# Patient Record
Sex: Female | Born: 1965 | Race: White | Hispanic: No | Marital: Married | Smoking: Never smoker
Health system: Southern US, Community
[De-identification: ages and names within clinical notes are randomized; demographics above are authoritative.]

## PROBLEM LIST (undated history)

## (undated) DIAGNOSIS — I4891 Unspecified atrial fibrillation: Secondary | ICD-10-CM

## (undated) DIAGNOSIS — I10 Essential (primary) hypertension: Secondary | ICD-10-CM

---

## 2006-05-31 ENCOUNTER — Encounter: Admission: RE | Admit: 2006-05-31 | Discharge: 2006-05-31 | Payer: Self-pay | Admitting: Obstetrics and Gynecology

## 2009-02-04 ENCOUNTER — Encounter: Admission: RE | Admit: 2009-02-04 | Discharge: 2009-02-04 | Payer: Self-pay | Admitting: Obstetrics and Gynecology

## 2010-04-07 ENCOUNTER — Encounter: Admission: RE | Admit: 2010-04-07 | Discharge: 2010-04-07 | Payer: Self-pay | Admitting: Obstetrics and Gynecology

## 2011-06-16 ENCOUNTER — Other Ambulatory Visit: Payer: Self-pay | Admitting: Obstetrics and Gynecology

## 2011-06-16 DIAGNOSIS — Z1231 Encounter for screening mammogram for malignant neoplasm of breast: Secondary | ICD-10-CM

## 2011-10-03 ENCOUNTER — Ambulatory Visit
Admission: RE | Admit: 2011-10-03 | Discharge: 2011-10-03 | Disposition: A | Discharge status: Home / Self Care | Payer: BC Managed Care – PPO | Source: Ambulatory Visit | Attending: Obstetrics and Gynecology | Admitting: Obstetrics and Gynecology

## 2011-10-03 DIAGNOSIS — Z1231 Encounter for screening mammogram for malignant neoplasm of breast: Secondary | ICD-10-CM

## 2013-12-12 ENCOUNTER — Other Ambulatory Visit: Payer: Self-pay

## 2013-12-12 DIAGNOSIS — Z1231 Encounter for screening mammogram for malignant neoplasm of breast: Secondary | ICD-10-CM

## 2013-12-19 ENCOUNTER — Ambulatory Visit
Admission: RE | Admit: 2013-12-19 | Discharge: 2013-12-19 | Disposition: A | Discharge status: Home / Self Care | Payer: BC Managed Care – PPO | Source: Ambulatory Visit

## 2013-12-19 DIAGNOSIS — Z1231 Encounter for screening mammogram for malignant neoplasm of breast: Secondary | ICD-10-CM

## 2016-03-02 ENCOUNTER — Other Ambulatory Visit: Payer: Self-pay | Admitting: Obstetrics and Gynecology

## 2016-03-02 DIAGNOSIS — Z1231 Encounter for screening mammogram for malignant neoplasm of breast: Secondary | ICD-10-CM

## 2016-03-13 ENCOUNTER — Ambulatory Visit
Admission: RE | Admit: 2016-03-13 | Discharge: 2016-03-13 | Disposition: A | Discharge status: Home / Self Care | Payer: BLUE CROSS/BLUE SHIELD | Source: Ambulatory Visit | Attending: Obstetrics and Gynecology | Admitting: Obstetrics and Gynecology

## 2016-03-13 DIAGNOSIS — Z1231 Encounter for screening mammogram for malignant neoplasm of breast: Secondary | ICD-10-CM

## 2017-11-23 ENCOUNTER — Other Ambulatory Visit: Payer: Self-pay

## 2017-11-23 ENCOUNTER — Emergency Department (HOSPITAL_COMMUNITY)
Admission: EM | Admit: 2017-11-23 | Discharge: 2017-11-23 | Disposition: A | Discharge status: Home / Self Care | Payer: BLUE CROSS/BLUE SHIELD | Attending: Emergency Medicine | Admitting: Emergency Medicine

## 2017-11-23 ENCOUNTER — Emergency Department (HOSPITAL_COMMUNITY): Payer: BLUE CROSS/BLUE SHIELD

## 2017-11-23 ENCOUNTER — Encounter (HOSPITAL_COMMUNITY): Payer: Self-pay

## 2017-11-23 DIAGNOSIS — I10 Essential (primary) hypertension: Secondary | ICD-10-CM | POA: Diagnosis not present

## 2017-11-23 DIAGNOSIS — R5383 Other fatigue: Secondary | ICD-10-CM | POA: Insufficient documentation

## 2017-11-23 DIAGNOSIS — I4891 Unspecified atrial fibrillation: Secondary | ICD-10-CM | POA: Insufficient documentation

## 2017-11-23 DIAGNOSIS — I499 Cardiac arrhythmia, unspecified: Secondary | ICD-10-CM | POA: Diagnosis present

## 2017-11-23 HISTORY — DX: Essential (primary) hypertension: I10

## 2017-11-23 HISTORY — DX: Unspecified atrial fibrillation: I48.91

## 2017-11-23 LAB — URINALYSIS, ROUTINE W REFLEX MICROSCOPIC
BILIRUBIN URINE: NEGATIVE
Bacteria, UA: NONE SEEN
Glucose, UA: NEGATIVE mg/dL
Ketones, ur: NEGATIVE mg/dL
LEUKOCYTES UA: NEGATIVE
NITRITE: NEGATIVE
PROTEIN: NEGATIVE mg/dL
Specific Gravity, Urine: 1.009 (ref 1.005–1.030)
pH: 7 (ref 5.0–8.0)

## 2017-11-23 LAB — CBC WITH DIFFERENTIAL/PLATELET
BASOS ABS: 0 10*3/uL (ref 0.0–0.1)
Basophils Relative: 0 %
EOS PCT: 1 %
Eosinophils Absolute: 0.1 10*3/uL (ref 0.0–0.7)
HCT: 42.1 % (ref 36.0–46.0)
Hemoglobin: 13.6 g/dL (ref 12.0–15.0)
Lymphocytes Relative: 29 %
Lymphs Abs: 3.2 10*3/uL (ref 0.7–4.0)
MCH: 28.2 pg (ref 26.0–34.0)
MCHC: 32.3 g/dL (ref 30.0–36.0)
MCV: 87.3 fL (ref 78.0–100.0)
MONO ABS: 0.6 10*3/uL (ref 0.1–1.0)
Monocytes Relative: 6 %
NEUTROS ABS: 7 10*3/uL (ref 1.7–7.7)
Neutrophils Relative %: 64 %
PLATELETS: 308 10*3/uL (ref 150–400)
RBC: 4.82 MIL/uL (ref 3.87–5.11)
RDW: 14.2 % (ref 11.5–15.5)
WBC: 10.9 10*3/uL — AB (ref 4.0–10.5)

## 2017-11-23 LAB — I-STAT TROPONIN, ED: Troponin i, poc: 0 ng/mL (ref 0.00–0.08)

## 2017-11-23 LAB — COMPREHENSIVE METABOLIC PANEL
ALBUMIN: 3.5 g/dL (ref 3.5–5.0)
ALT: 23 U/L (ref 14–54)
AST: 20 U/L (ref 15–41)
Alkaline Phosphatase: 77 U/L (ref 38–126)
Anion gap: 8 (ref 5–15)
BUN: 10 mg/dL (ref 6–20)
CHLORIDE: 104 mmol/L (ref 101–111)
CO2: 26 mmol/L (ref 22–32)
Calcium: 8.9 mg/dL (ref 8.9–10.3)
Creatinine, Ser: 0.77 mg/dL (ref 0.44–1.00)
GFR calc Af Amer: >60 mL/min (ref >60)
GFR calc non Af Amer: >60 mL/min (ref >60)
Glucose, Bld: 111 mg/dL — ABNORMAL HIGH (ref 65–99)
POTASSIUM: 3.9 mmol/L (ref 3.5–5.1)
Sodium: 138 mmol/L (ref 135–145)
Total Bilirubin: 0.3 mg/dL (ref 0.3–1.2)
Total Protein: 7.3 g/dL (ref 6.5–8.1)

## 2017-11-23 LAB — MAGNESIUM: Magnesium: 1.8 mg/dL (ref 1.7–2.4)

## 2017-11-23 NOTE — ED Provider Notes (Signed)
[facility] EMERGENCY DEPARTMENT Provider Note   CSN: [id number] Arrival date & time: 11/23/17  1757     History   Chief Complaint Chief Complaint  Patient presents with  . Atrial Flutter    HPI April Ortiz is a 52 y.o. female.  The history is provided by the patient and medical records. No language interpreter was used.  Palpitations   This is a new problem. The current episode started 12 to 24 hours ago. The problem occurs daily. The problem has been resolved. The problem is associated with anti-arrhythmics. Associated symptoms include malaise/fatigue, irregular heartbeat, near-syncope and lower extremity edema (chronic). Pertinent negatives include no diaphoresis, no fever, no numbness, no chest pain, no chest pressure, no exertional chest pressure, no syncope, no abdominal pain, no nausea, no vomiting, no headaches, no back pain, no leg pain, no dizziness, no weakness, no cough, no shortness of breath and no sputum production. She has tried nothing for the symptoms. The treatment provided no relief.    Past Medical History:  Diagnosis Date  . Atrial fibrillation (HCC)   . Hypertension     There are no active problems to display for this patient.   History reviewed. No pertinent surgical history.   OB History   None      Home Medications    Prior to Admission medications   Not on File    Family History History reviewed. No pertinent family history.  Social History Social History   Tobacco Use  . Smoking status: Never Smoker  . Smokeless tobacco: Never Used  Substance Use Topics  . Alcohol use: Never    Frequency: Never  . Drug use: Never     Allergies   Azithromycin   Review of Systems Review of Systems  Constitutional: Positive for fatigue and malaise/fatigue. Negative for chills, diaphoresis and fever.  HENT: Negative for congestion.   Eyes: Negative for visual disturbance.  Respiratory: Negative for cough, sputum  production, chest tightness, shortness of breath, wheezing and stridor.   Cardiovascular: Positive for palpitations and near-syncope. Negative for chest pain and syncope.  Gastrointestinal: Negative for abdominal pain, nausea and vomiting.  Genitourinary: Negative for flank pain.  Musculoskeletal: Negative for back pain, neck pain and neck stiffness.  Neurological: Positive for light-headedness. Negative for dizziness, syncope, speech difficulty, weakness, numbness and headaches.  Psychiatric/Behavioral: Negative for agitation.  All other systems reviewed and are negative.    Physical Exam Updated Vital Signs BP 127/78   Pulse 71   Resp (!) 23   LMP  (LMP Unknown)   SpO2 96%   Physical Exam  Constitutional: She appears well-developed and well-nourished. No distress.  HENT:  Head: Normocephalic and atraumatic.  Nose: Nose normal.  Mouth/Throat: Oropharynx is clear and moist. No oropharyngeal exudate.  Eyes: Pupils are equal, round, and reactive to light. Conjunctivae and EOM are normal.  Neck: Neck supple.  Cardiovascular: Intact distal pulses. An irregularly irregular rhythm present.  No murmur heard. Pulmonary/Chest: Effort normal and breath sounds normal. No tachypnea. No respiratory distress. She has no wheezes. She has no rhonchi. She has no rales. She exhibits no tenderness.  Abdominal: Soft. She exhibits no distension. There is no tenderness.  Musculoskeletal: She exhibits edema. She exhibits no tenderness or deformity.  Neurological: She is alert. No sensory deficit. She exhibits normal muscle tone.  Skin: Skin is warm and dry. Capillary refill takes less than 2 seconds. No rash noted. She is not diaphoretic. No erythema.  Psychiatric: She has  a normal mood and affect.  Nursing note and vitals reviewed.    ED Treatments / Results  Labs (all labs ordered are listed, but only abnormal results are displayed) Labs Reviewed  CBC WITH DIFFERENTIAL/PLATELET - Abnormal;  Notable for the following components:      Result Value   WBC 10.9 (*)    All other components within normal limits  COMPREHENSIVE METABOLIC PANEL - Abnormal; Notable for the following components:   Glucose, Bld 111 (*)    All other components within normal limits  URINALYSIS, ROUTINE W REFLEX MICROSCOPIC - Abnormal; Notable for the following components:   Color, Urine STRAW (*)    Hgb urine dipstick SMALL (*)    All other components within normal limits  URINE CULTURE  MAGNESIUM  I-STAT TROPONIN, ED    EKG EKG Interpretation  Date/Time:  Friday Nov 23 2017 18:25:48 EDT Ventricular Rate:  75 PR Interval:    QRS Duration: 96 QT Interval:  386 QTC Calculation: 432 R Axis:   22 Text Interpretation:  Atrial fibrillation Minimal ST elevation, inferior leads No prio rECG for comparison. Afib.  No STEMI Confirmed by Theda Belfast ([id number]) on 11/23/2017 6:31:33 PM   Radiology Dg Chest 2 View  Result Date: 11/23/2017 CLINICAL DATA:  Dizziness, lightheadedness EXAM: CHEST - 2 VIEW COMPARISON:  None. FINDINGS: Lungs are clear.  No pleural effusion or pneumothorax. The heart is normal in size. Degenerative changes of the visualized thoracolumbar spine. IMPRESSION: Normal chest radiographs. Electronically Signed   By: Charline Bills M.D.   On: 11/23/2017 20:08    Procedures Procedures (including critical care time)  Medications Ordered in ED Medications - No data to display   Initial Impression / Assessment and Plan / ED Course  I have reviewed the triage vital signs and the nursing notes.  Pertinent labs & imaging results that were available during my care of the patient were reviewed by me and considered in my medical decision making (see chart for details).     April Ortiz is a 52 y.o. female with a past medical history significant for hypertension atrial fibrillation on Xarelto who presents from urgent care for lightheadedness, nursing to be, and A. fib with RVR.  According  to patient, she was feeling lightheaded today and had several episodes of near syncope.  She reports that she went to an urgent care where she was found to be in A. fib with a rate in the 160s.  Patient was then transferred by EMS to this emergency department for further evaluation.  Patient says that she has had a history of A. fib and is on Xarelto.  She is also on metoprolol and flecainide.  She says that she has had no chest pain or shortness of breath.  She does report some palpitations and feeling her heart racing occasionally.  She denies recent trauma.  She denies nausea, vomiting, diaphoresis, or syncopal episodes.  She denies any urinary symptoms or GI symptoms.  She denies any recent medication changes.  On exam, patient has clear lungs.  Chest is nontender.  Abdomen is nontender.  Lower tremors had mild bilateral edema which patient reports is unchanged from her baseline.  No back tenderness or CVA tenderness.  Patient had normal sensation and strength in extremities.  Normal pulses in upper and lower extremities.  Initial EKG showed A. fib with a rate in the 80s.  No evidence of RVR at this time.  Due to patient's fatigue and the A. fib with  RVR which was reported, patient will work-up to look for elective ab normality, dehydration, or occult infection which may have prompted this.    Patient will be encouraged to orally hydrate as her blood pressure was normal and her legs are slightly edematous, will hold on IV fluids so as to not over hydrate.  If patient's work-up is negative and she demonstrates stability with no evidence of RVR, patient may be stable for discharge home.  Patient's work-up was overall reassuring.  Mild leukocytosis.  No evidence of infection and chest x-ray reassuring.    Patient was observed for several hours with no evidence of recurrent RVR.  Suspect transient RVR.  Patient was able to tolerate p.o. without difficulty and was observed.  Patient was not felt to  require admission given the continued absence of symptoms.  Patient understood return precautions and follow-up instructions.  Patient had no other questions or concerns and was discharged in good condition.    Final Clinical Impressions(s) / ED Diagnoses   Final diagnoses:  Atrial fibrillation with RVR (HCC)  Atrial fibrillation, unspecified type [facility])    ED Discharge Orders    None       Clinical Impression: 1. Atrial fibrillation with RVR (HCC)   2. Atrial fibrillation, unspecified type (HCC)     Disposition: Discharge  Condition: Good  I have discussed the results, Dx and Tx plan with the pt(& family if present). He/she/they expressed understanding and agree(s) with the plan. Discharge instructions discussed at great length. Strict return precautions discussed and pt &/or family have verbalized understanding of the instructions. No further questions at time of discharge.    Discharge Medication List as of 11/23/2017 11:35 PM      Follow Up: [place] [address] [place] [address] [phone]     [facility] EMERGENCY DEPARTMENT [address] [id number] mc [place] [address] [phone]       Dorsey Authement, Canary Brim, MD 11/24/17 [phone]

## 2017-11-23 NOTE — ED Notes (Addendum)
Patient transported to X-ray 

## 2017-11-23 NOTE — Discharge Instructions (Signed)
Your work-up today was overall reassuring and we did not see any evidence of the A. fib with RVR which she had previously.  Please stay hydrated and follow-up with your regular doctor in several days.  If any symptoms change or worsen, please return to the nearest emergency department.

## 2017-11-23 NOTE — ED Notes (Signed)
Pt given ice water per request for fluid challenge.

## 2017-11-23 NOTE — ED Triage Notes (Signed)
Patient from urgent care for dizziness with EKG showing atrial flutter.  Patient states history of A FIB.  No meds given by EMS.  HR of 70s while being transported.  A&Ox4 with only complaint is dizziness

## 2017-11-23 NOTE — ED Notes (Signed)
ED Provider at bedside. 

## 2017-11-25 LAB — URINE CULTURE

## 2017-11-26 ENCOUNTER — Telehealth: Payer: Self-pay | Admitting: Emergency Medicine

## 2017-11-26 NOTE — Telephone Encounter (Signed)
Post ED Visit - Positive Culture Follow-up  Culture report reviewed by antimicrobial stewardship pharmacist:   Enzo Bi, Pharm.D.  Celedonio Miyamoto, Pharm.D., BCPS AQ-ID  Garvin Fila, Pharm.D., BCPS  Georgina Pillion, Pharm.D., BCPS  [place], [address].D., BCPS, AAHIVP  Estella Husk, Pharm.D., BCPS, AAHIVP  Lysle Pearl, PharmD, BCPS  Sherlynn Carbon, PharmD  Pollyann Samples, PharmD, BCPS  Positive urine culture Treated with none, asymptomatic, no further patient follow-up is required at this time.  Berle Mull 11/26/2017, 1:07 PM

## 2021-05-10 ENCOUNTER — Other Ambulatory Visit: Payer: Self-pay | Admitting: Physician Assistant

## 2021-05-10 ENCOUNTER — Other Ambulatory Visit: Payer: Self-pay

## 2021-05-10 ENCOUNTER — Ambulatory Visit
Admission: RE | Admit: 2021-05-10 | Discharge: 2021-05-10 | Disposition: A | Discharge status: Home / Self Care | Payer: BC Managed Care – PPO | Source: Ambulatory Visit | Attending: Physician Assistant | Admitting: Physician Assistant

## 2021-05-10 DIAGNOSIS — Z1231 Encounter for screening mammogram for malignant neoplasm of breast: Secondary | ICD-10-CM

## 2022-05-03 IMAGING — MG MM DIGITAL SCREENING BILAT W/ TOMO AND CAD
8 series · 8 of 20 positions shown · non-contrast
Comparison: Previous exam(s).

ACR Breast Density Category a: The breast tissue is almost entirely
fatty.

CLINICAL DATA: Screening.

EXAM:
DIGITAL SCREENING BILATERAL MAMMOGRAM WITH TOMOSYNTHESIS AND CAD
TECHNIQUE: Bilateral screening digital craniocaudal and mediolateral oblique
mammograms were obtained. Bilateral screening digital breast
tomosynthesis was performed. The images were evaluated with
computer-aided detection.

[R CV synth-2D]
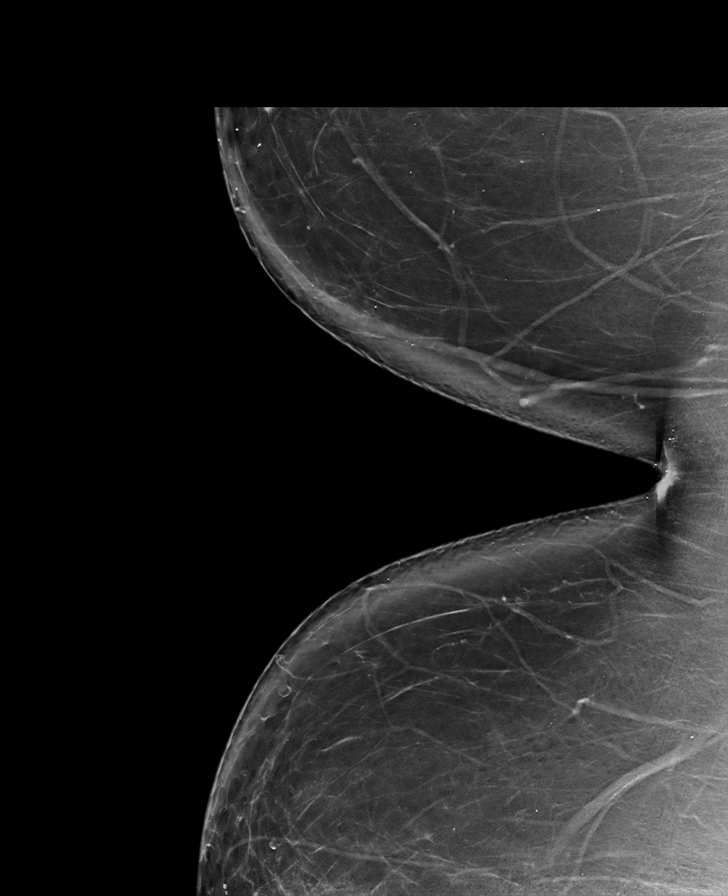

[R MLO synth-2D]
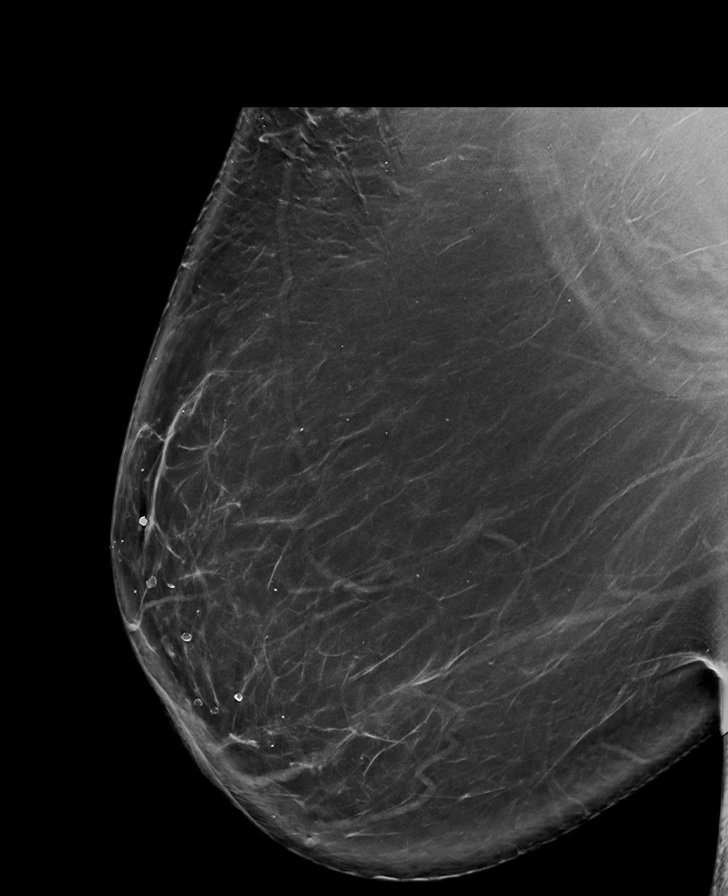

[L MLO synth-2D]
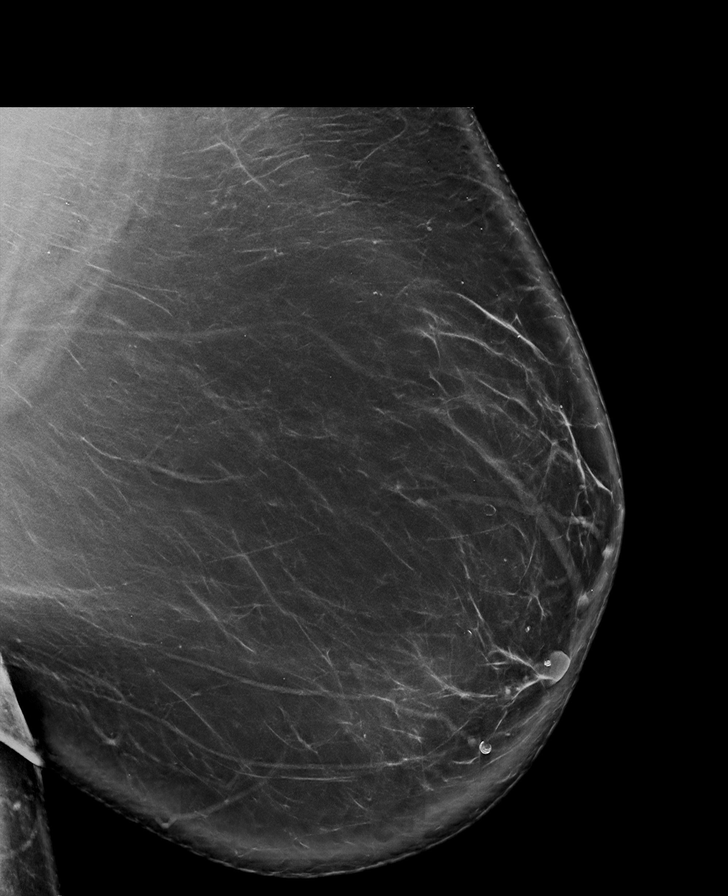

[R CC synth-2D]
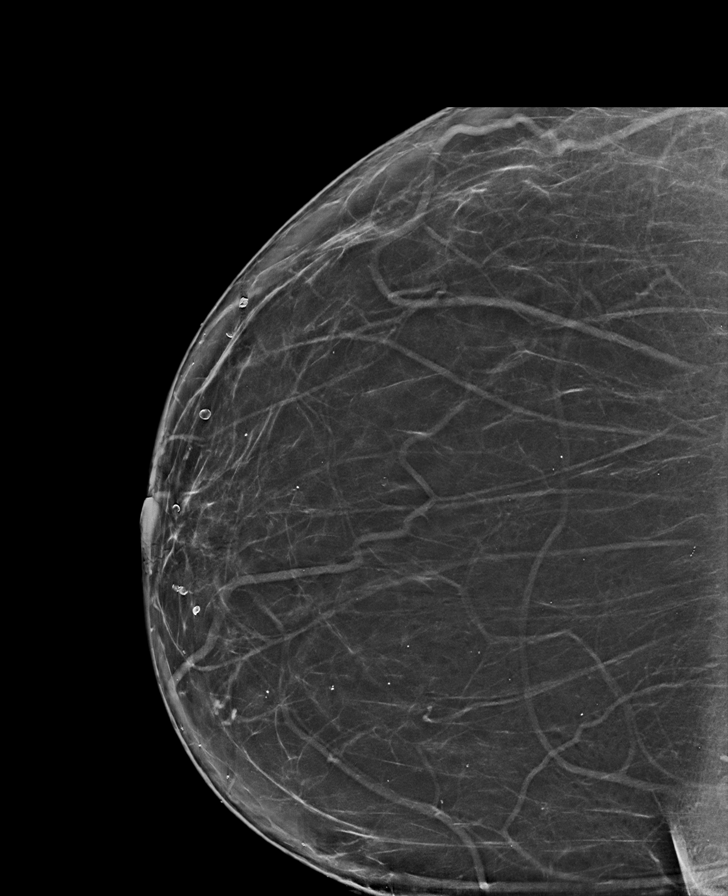

[L CC synth-2D]
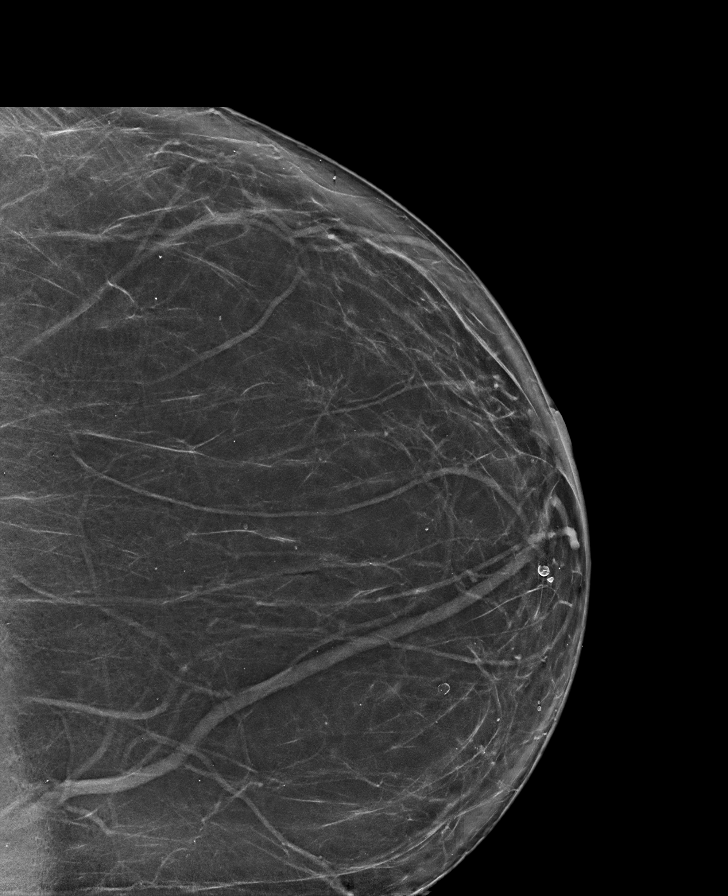

[R CC tomo · tomo slice 47/94.0]
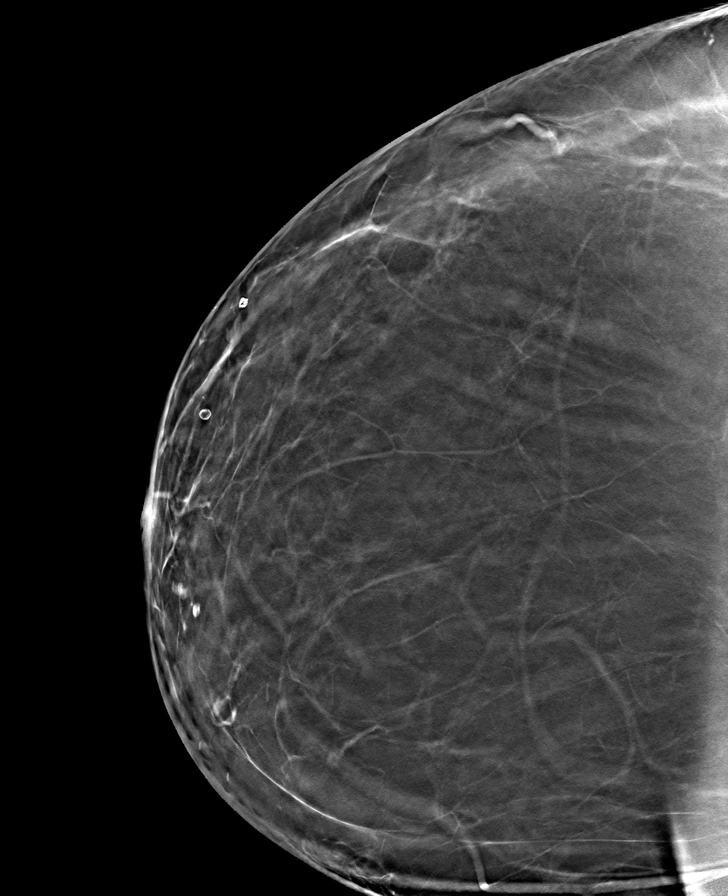

[R CV tomo · tomo slice 49/96.0]
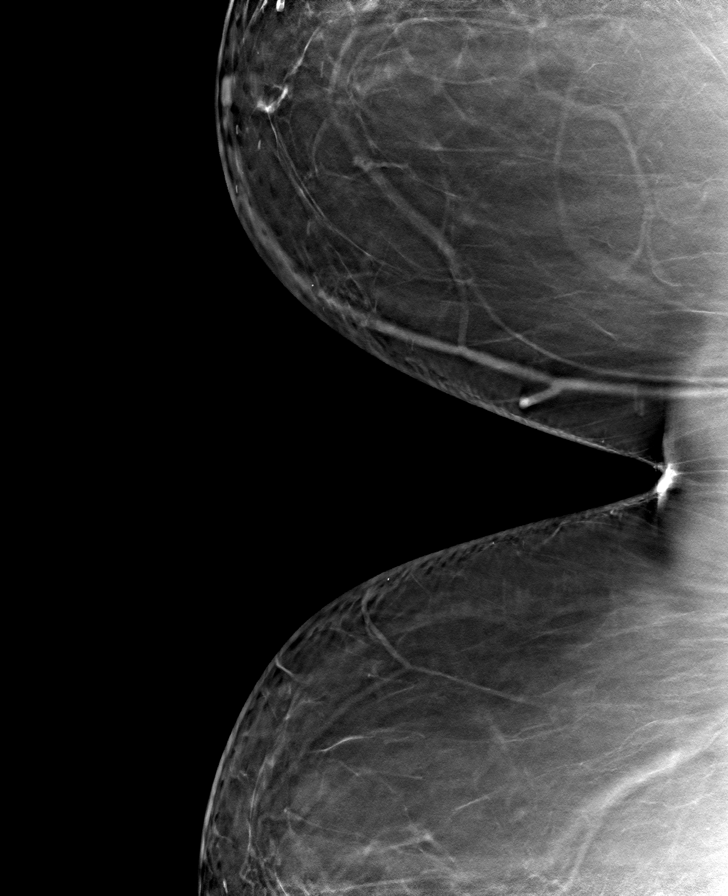

[L CC tomo · tomo slice 48/95.0]
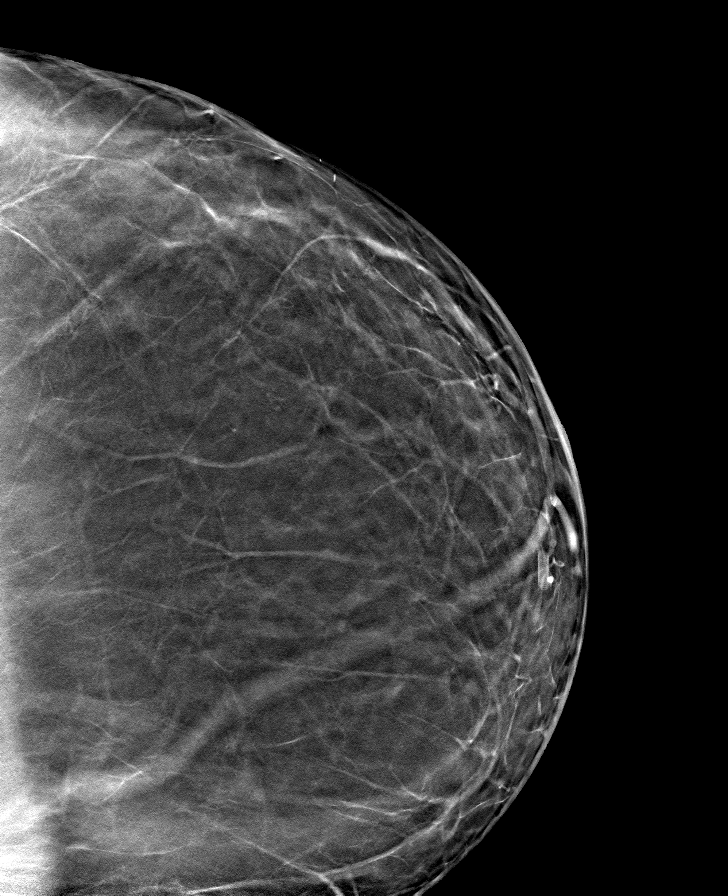

[8 of 20 positions shown; findings below may reference images not displayed]

FINDINGS: There are no findings suspicious for malignancy.
IMPRESSION: No mammographic evidence of malignancy. A result letter of this
screening mammogram will be mailed directly to the patient.

RECOMMENDATION:
Screening mammogram in one year. (Code:0E-3-N98)

BI-RADS CATEGORY  1: Negative.
